# Patient Record
Sex: Male | Born: 2000 | Hispanic: No | Marital: Single | Smoking: Current some day smoker
Health system: Southern US, Community
[De-identification: ages and names within clinical notes are randomized; demographics above are authoritative.]

---
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
## 2018-01-30 IMAGING — CT CT ABD-PELV W/ CM
2 of 4 series · 15 of 46 positions shown, 17 images · IV contrast (APPLIED)
Comparison: CT abdomen and pelvis 03/07/2017.

CLINICAL DATA: Generalized abdominal pain centered in the left
lower quadrant with nausea and vomiting beginning this afternoon.

EXAM:
CT ABDOMEN AND PELVIS WITH CONTRAST
TECHNIQUE: Multidetector CT imaging of the abdomen and pelvis was performed
using the standard protocol following bolus administration of
intravenous contrast.
CONTRAST:  75 ml QPZERL-6SS IOPAMIDOL (QPZERL-6SS) INJECTION 61%

[Series 2: axial st · axial · 0.60mm/px · z∈[-977,-587]mm · 12 of 86 slices shown, 14 images]
[im 4/86  soft-tissue]
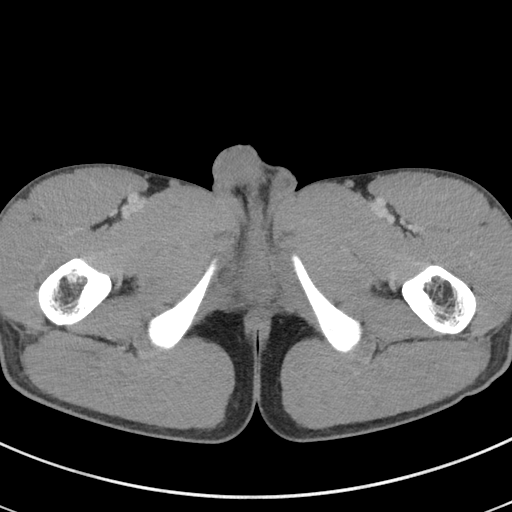
[im 4/86  bone]
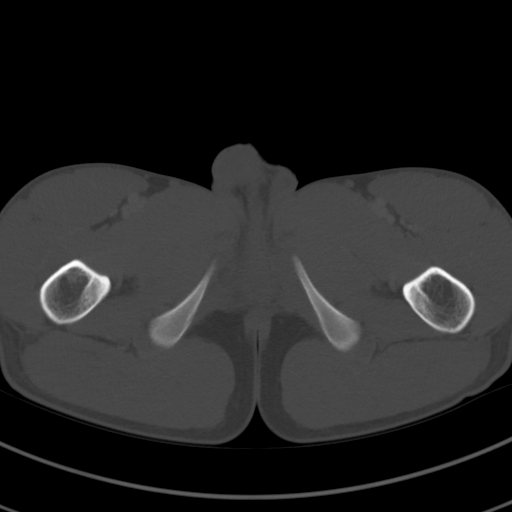
[im 11/86  soft-tissue]
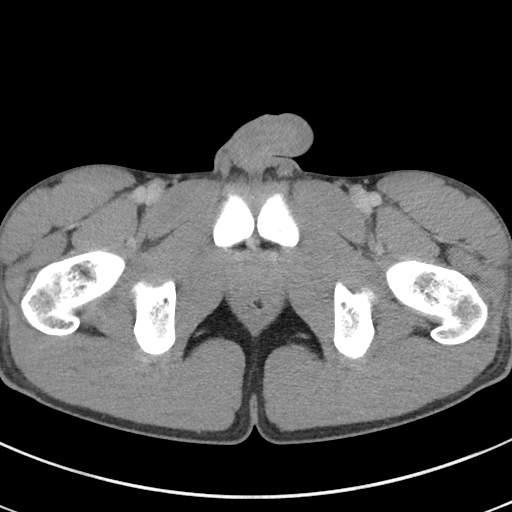
[im 18/86  soft-tissue]
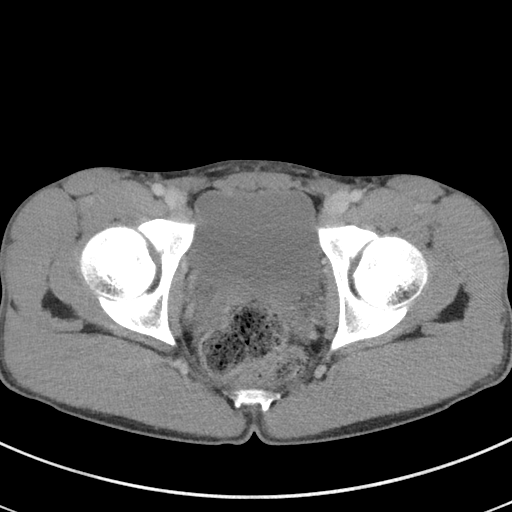
[im 25/86  soft-tissue]
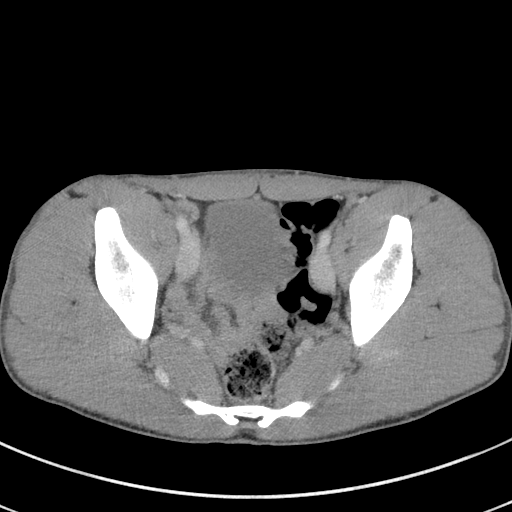
[im 32/86  soft-tissue]
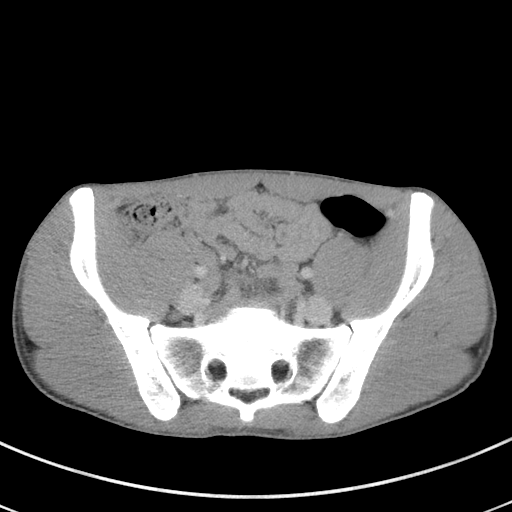
[im 39/86  soft-tissue]
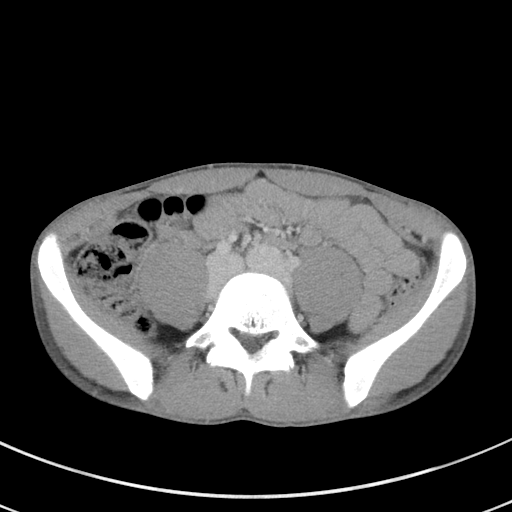
[im 47/86  soft-tissue]
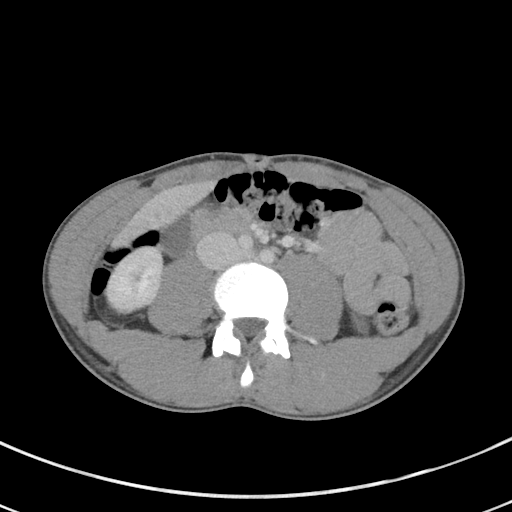
[im 54/86  soft-tissue]
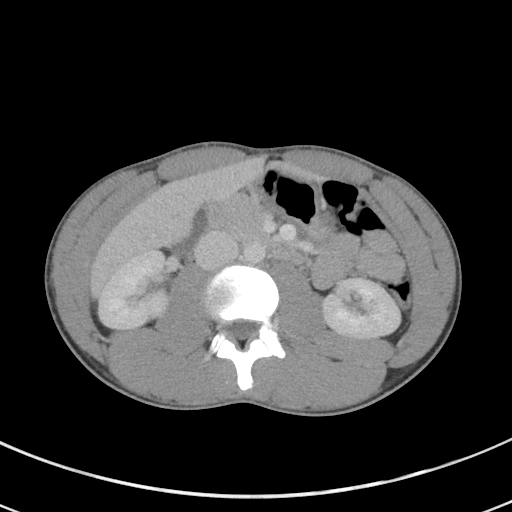
[im 61/86  soft-tissue]
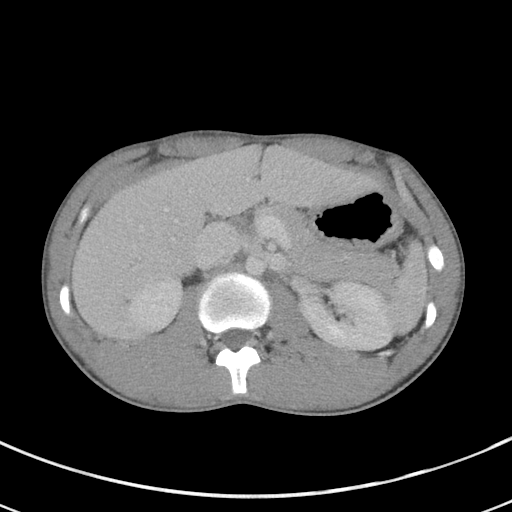
[im 61/86  bone]
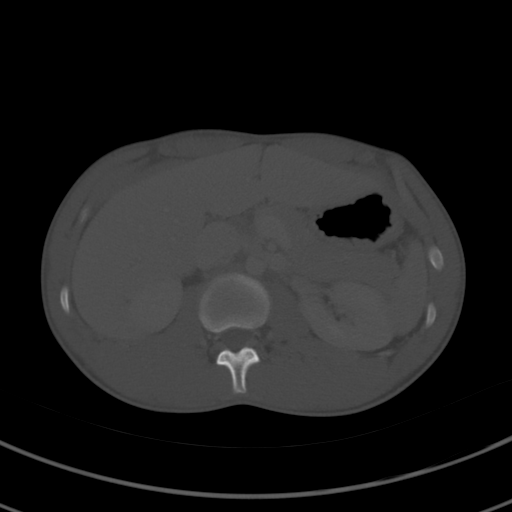
[im 68/86  soft-tissue]
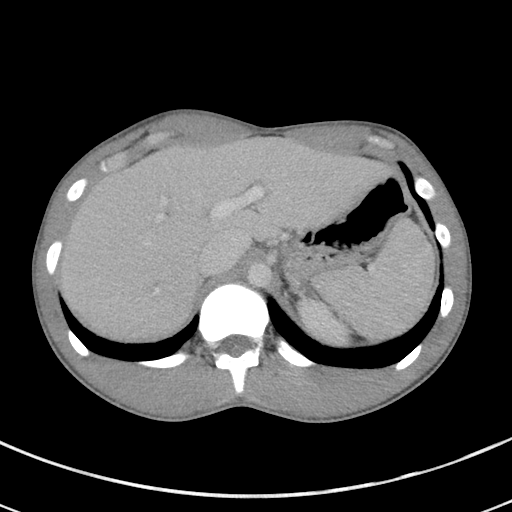
[im 75/86  soft-tissue]
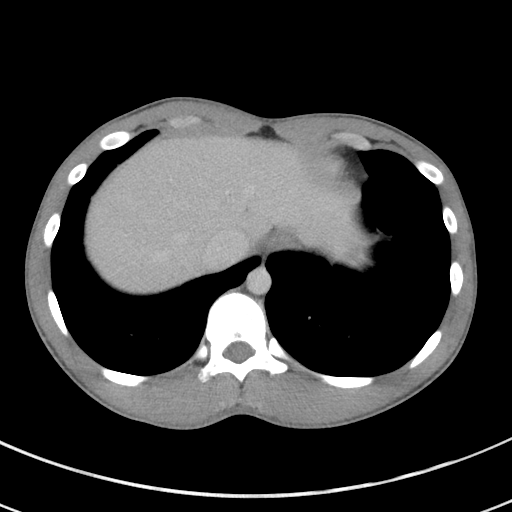
[im 82/86  soft-tissue]
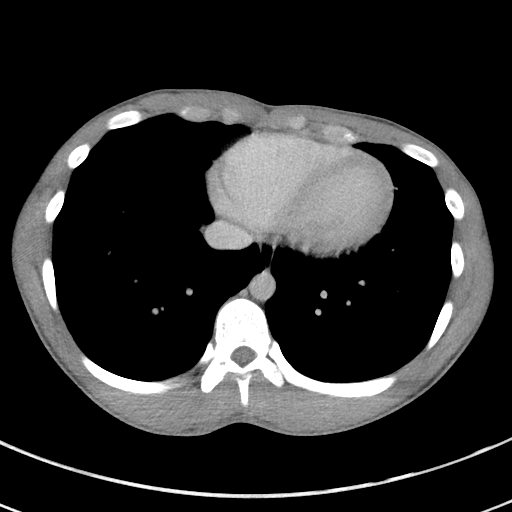

[Series 5: coronal st · coronal · 0.54mm/px · 3 of 70 slices shown]
[im 24/70  soft-tissue]
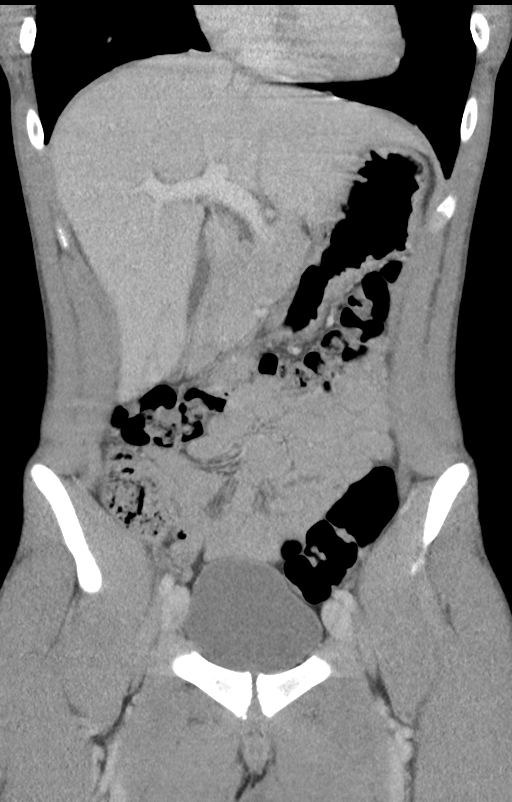
[im 31/70  soft-tissue]
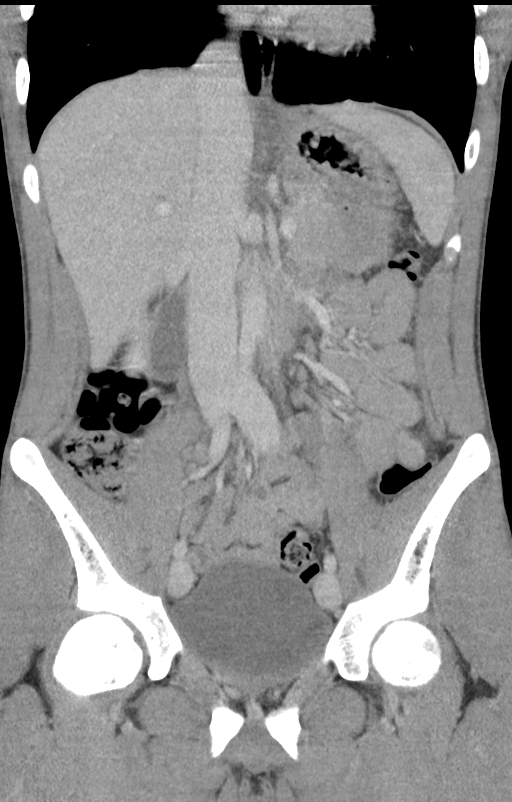
[im 39/70  soft-tissue]
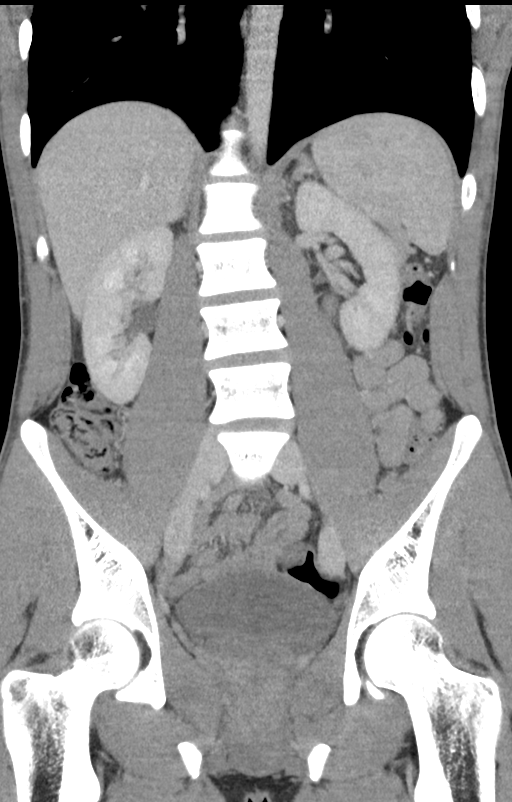

[15 of 46 positions shown; findings below may reference images not displayed]

FINDINGS: Lower chest: Lung bases are clear. No pleural or pericardial
effusion.

Hepatobiliary: No focal liver abnormality is seen. No gallstones,
gallbladder wall thickening, or biliary dilatation.

Pancreas: Unremarkable. No pancreatic ductal dilatation or
surrounding inflammatory changes.

Spleen: Normal in size without focal abnormality.

Adrenals/Urinary Tract: Adrenal glands are unremarkable. Kidneys are
normal, without renal calculi, focal lesion, or hydronephrosis.
Bladder is unremarkable.

Stomach/Bowel: Stomach is within normal limits. Appendix appears
normal. No evidence of bowel wall thickening, distention, or
inflammatory changes.

Vascular/Lymphatic: No significant vascular findings are present. No
enlarged abdominal or pelvic lymph nodes.

Reproductive: Prostate is unremarkable.

Other: No abdominal wall hernia or abnormality. No abdominopelvic
ascites.

Musculoskeletal: Negative.
IMPRESSION: Negative CT abdomen and pelvis. No finding to explain the patient's
symptoms.

## 2018-03-18 ENCOUNTER — Other Ambulatory Visit: Payer: Self-pay

## 2018-03-18 ENCOUNTER — Emergency Department (HOSPITAL_COMMUNITY)
Admission: EM | Admit: 2018-03-18 | Discharge: 2018-03-19 | Disposition: A | Discharge status: Home / Self Care | Payer: Medicaid Other | Attending: Emergency Medicine | Admitting: Emergency Medicine

## 2018-03-18 ENCOUNTER — Encounter (HOSPITAL_COMMUNITY): Payer: Self-pay

## 2018-03-18 ENCOUNTER — Emergency Department (HOSPITAL_COMMUNITY): Payer: Medicaid Other

## 2018-03-18 DIAGNOSIS — R112 Nausea with vomiting, unspecified: Secondary | ICD-10-CM | POA: Diagnosis not present

## 2018-03-18 DIAGNOSIS — S79911A Unspecified injury of right hip, initial encounter: Secondary | ICD-10-CM | POA: Diagnosis present

## 2018-03-18 DIAGNOSIS — F17200 Nicotine dependence, unspecified, uncomplicated: Secondary | ICD-10-CM | POA: Diagnosis not present

## 2018-03-18 DIAGNOSIS — W3400XA Accidental discharge from unspecified firearms or gun, initial encounter: Secondary | ICD-10-CM | POA: Diagnosis not present

## 2018-03-18 DIAGNOSIS — S72411A Displaced unspecified condyle fracture of lower end of right femur, initial encounter for closed fracture: Secondary | ICD-10-CM | POA: Diagnosis not present

## 2018-03-18 DIAGNOSIS — Y9389 Activity, other specified: Secondary | ICD-10-CM | POA: Insufficient documentation

## 2018-03-18 DIAGNOSIS — Y929 Unspecified place or not applicable: Secondary | ICD-10-CM | POA: Diagnosis not present

## 2018-03-18 DIAGNOSIS — Y999 Unspecified external cause status: Secondary | ICD-10-CM | POA: Diagnosis not present

## 2018-03-18 LAB — BASIC METABOLIC PANEL
Anion gap: 8 (ref 5–15)
BUN: 14 mg/dL (ref 6–20)
CHLORIDE: 108 mmol/L (ref 101–111)
CO2: 21 mmol/L — ABNORMAL LOW (ref 22–32)
CREATININE: 1.22 mg/dL (ref 0.61–1.24)
Calcium: 9.3 mg/dL (ref 8.9–10.3)
GFR calc non Af Amer: 34 mL/min — ABNORMAL LOW (ref >60)
GFR, EST AFRICAN AMERICAN: 40 mL/min — AB (ref >60)
Glucose, Bld: 112 mg/dL — ABNORMAL HIGH (ref 65–99)
POTASSIUM: 4.1 mmol/L (ref 3.5–5.1)
SODIUM: 137 mmol/L (ref 135–145)

## 2018-03-18 LAB — I-STAT CHEM 8, ED
BUN: 15 mg/dL (ref 6–20)
CALCIUM ION: 1.13 mmol/L — AB (ref 1.15–1.40)
CHLORIDE: 105 mmol/L (ref 101–111)
Creatinine, Ser: 1.2 mg/dL (ref 0.61–1.24)
GLUCOSE: 109 mg/dL — AB (ref 65–99)
HCT: 39 % (ref 39.0–52.0)
Hemoglobin: 13.3 g/dL (ref 13.0–17.0)
Potassium: 4.1 mmol/L (ref 3.5–5.1)
SODIUM: 141 mmol/L (ref 135–145)
TCO2: 24 mmol/L (ref 22–32)

## 2018-03-18 LAB — CBC WITH DIFFERENTIAL/PLATELET
BASOS ABS: 0 10*3/uL (ref 0.0–0.1)
Basophils Relative: 0 %
Eosinophils Absolute: 0 10*3/uL (ref 0.0–0.7)
Eosinophils Relative: 0 %
HCT: 39.2 % (ref 39.0–52.0)
HEMOGLOBIN: 13.4 g/dL (ref 13.0–17.0)
LYMPHS ABS: 1.8 10*3/uL (ref 0.7–4.0)
LYMPHS PCT: 17 %
MCH: 29.5 pg (ref 26.0–34.0)
MCHC: 34.2 g/dL (ref 30.0–36.0)
MCV: 86.3 fL (ref 78.0–100.0)
Monocytes Absolute: 0.8 10*3/uL (ref 0.1–1.0)
Monocytes Relative: 7 %
NEUTROS PCT: 76 %
Neutro Abs: 8 10*3/uL — ABNORMAL HIGH (ref 1.7–7.7)
Platelets: 271 10*3/uL (ref 150–400)
RBC: 4.54 MIL/uL (ref 4.22–5.81)
RDW: 13 % (ref 11.5–15.5)
WBC: 10.6 10*3/uL — AB (ref 4.0–10.5)

## 2018-03-18 MED ORDER — IOPAMIDOL (ISOVUE-370) INJECTION 76%
80.0000 mL | Freq: Once | INTRAVENOUS | Status: AC | PRN
  Administered 2018-03-18: via INTRAVENOUS
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
## 2018-03-19 ENCOUNTER — Emergency Department (HOSPITAL_COMMUNITY)
Admission: EM | Admit: 2018-03-19 | Discharge: 2018-03-19 | Disposition: A | Discharge status: Home / Self Care | Payer: Medicaid Other | Attending: Emergency Medicine | Admitting: Emergency Medicine

## 2018-03-19 ENCOUNTER — Encounter (HOSPITAL_COMMUNITY): Payer: Self-pay | Admitting: Emergency Medicine

## 2018-03-19 DIAGNOSIS — Z760 Encounter for issue of repeat prescription: Secondary | ICD-10-CM | POA: Insufficient documentation

## 2018-03-19 DIAGNOSIS — Z79899 Other long term (current) drug therapy: Secondary | ICD-10-CM | POA: Insufficient documentation

## 2018-03-19 DIAGNOSIS — R112 Nausea with vomiting, unspecified: Secondary | ICD-10-CM | POA: Insufficient documentation

## 2018-03-19 DIAGNOSIS — Z87891 Personal history of nicotine dependence: Secondary | ICD-10-CM | POA: Insufficient documentation

## 2018-03-19 MED ORDER — ONDANSETRON 4 MG PO TBDP
4.0000 mg | ORAL_TABLET | Freq: Once | ORAL | Status: AC | PRN
  Administered 2018-03-19: 4 mg via ORAL
  Filled 2018-03-19: qty 1

## 2018-03-19 MED ORDER — CEPHALEXIN 500 MG PO CAPS: 500.0000 mg | ORAL_CAPSULE | Freq: Four times a day (QID) | ORAL | 0 refills | Status: AC

## 2018-03-19 MED ORDER — CEPHALEXIN 500 MG PO CAPS: 500.0000 mg | ORAL_CAPSULE | Freq: Four times a day (QID) | ORAL | 0 refills | Status: DC

## 2018-03-19 MED ORDER — OXYCODONE-ACETAMINOPHEN 5-325 MG PO TABS
1.0000 | ORAL_TABLET | Freq: Once | ORAL | Status: AC
  Administered 2018-03-19: 1 via ORAL
  Filled 2018-03-19: qty 1

## 2018-03-19 MED ORDER — ONDANSETRON HCL 4 MG PO TABS: 4.0000 mg | ORAL_TABLET | Freq: Three times a day (TID) | ORAL | 0 refills | Status: AC | PRN
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
## 2018-03-19 NOTE — Discharge Instructions (Addendum)
Please take all of your antibiotics until finished!   You may develop abdominal discomfort or diarrhea from the antibiotic.  You may help offset this with probiotics which you can buy or get in yogurt. Do not eat  or take the probiotics until 2 hours after your antibiotic.   He may take Zofran as needed for nausea. Wait around 20 minutes before having anything to eat or drink to get this medication time to work.  Keep the knee immobilizer on as instructed. Follow up with orthopedics as instructed for reevaluation of your fracture. Return to the emergency department if any concerning signs or symptoms develop such as abnormal drainage, fevers, redness around the wound severe pain, persistent vomiting or severe headache
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
## 2018-03-19 NOTE — ED Triage Notes (Signed)
Pt was discharged from the hospital but checked back in stating he needed to get his medication as he refused to give his name earlier.  Now complains of emesis.
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
## 2018-03-19 NOTE — ED Notes (Signed)
Pt was given a cab voucher by case management and placed outside at bench per pt request. NAD.
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
## 2018-04-01 ENCOUNTER — Encounter (HOSPITAL_COMMUNITY): Payer: Self-pay | Admitting: Emergency Medicine

## 2022-11-16 ENCOUNTER — Other Ambulatory Visit (HOSPITAL_COMMUNITY): Payer: Self-pay
